# Patient Record
Sex: Male | Born: 1955 | Race: Black or African American | Hispanic: No | Marital: Single
Health system: Southern US, Community
[De-identification: ages and names within clinical notes are randomized; demographics above are authoritative.]

---

## 2003-11-18 ENCOUNTER — Ambulatory Visit: Payer: Self-pay | Admitting: Oncology

## 2003-12-19 ENCOUNTER — Ambulatory Visit: Payer: Self-pay | Admitting: Oncology

## 2004-01-22 ENCOUNTER — Ambulatory Visit: Payer: Self-pay | Admitting: Oncology

## 2004-02-18 ENCOUNTER — Ambulatory Visit: Payer: Self-pay | Admitting: Oncology

## 2004-03-20 ENCOUNTER — Ambulatory Visit: Payer: Self-pay | Admitting: Oncology

## 2004-04-17 ENCOUNTER — Ambulatory Visit: Payer: Self-pay | Admitting: Oncology

## 2004-06-04 ENCOUNTER — Ambulatory Visit: Payer: Self-pay | Admitting: Oncology

## 2004-06-17 ENCOUNTER — Ambulatory Visit: Payer: Self-pay | Admitting: Oncology

## 2004-09-04 ENCOUNTER — Ambulatory Visit: Payer: Self-pay | Admitting: Oncology

## 2007-10-25 ENCOUNTER — Other Ambulatory Visit: Payer: Self-pay

## 2007-10-25 ENCOUNTER — Inpatient Hospital Stay: Payer: Self-pay | Admitting: Internal Medicine

## 2008-03-02 ENCOUNTER — Ambulatory Visit: Payer: Self-pay

## 2011-02-08 ENCOUNTER — Emergency Department: Payer: Self-pay | Admitting: Emergency Medicine

## 2011-05-06 ENCOUNTER — Inpatient Hospital Stay: Payer: Self-pay | Admitting: Internal Medicine

## 2011-05-06 LAB — COMPREHENSIVE METABOLIC PANEL
Albumin: 3.2 g/dL — ABNORMAL LOW (ref 3.4–5.0)
Alkaline Phosphatase: 70 U/L (ref 50–136)
BUN: 34 mg/dL — ABNORMAL HIGH (ref 7–18)
Bilirubin,Total: 0.7 mg/dL (ref 0.2–1.0)
Chloride: 91 mmol/L — ABNORMAL LOW (ref 98–107)
Co2: 24 mmol/L (ref 21–32)
Creatinine: 1.53 mg/dL — ABNORMAL HIGH (ref 0.60–1.30)
EGFR (Non-African Amer.): 51 — ABNORMAL LOW
Glucose: 218 mg/dL — ABNORMAL HIGH (ref 65–99)
Osmolality: 269 (ref 275–301)
SGPT (ALT): 17 U/L
Sodium: 127 mmol/L — ABNORMAL LOW (ref 136–145)
Total Protein: 8.5 g/dL — ABNORMAL HIGH (ref 6.4–8.2)

## 2011-05-06 LAB — CBC
HCT: 31.3 % — ABNORMAL LOW (ref 40.0–52.0)
HGB: 10.4 g/dL — ABNORMAL LOW (ref 13.0–18.0)
MCH: 30.4 pg (ref 26.0–34.0)
RBC: 3.42 10*6/uL — ABNORMAL LOW (ref 4.40–5.90)

## 2011-05-06 LAB — CK TOTAL AND CKMB (NOT AT ARMC): CK-MB: 0.9 ng/mL (ref 0.5–3.6)

## 2011-05-07 LAB — BASIC METABOLIC PANEL
Anion Gap: 14 (ref 7–16)
BUN: 31 mg/dL — ABNORMAL HIGH (ref 7–18)
Calcium, Total: 10.1 mg/dL (ref 8.5–10.1)
Chloride: 97 mmol/L — ABNORMAL LOW (ref 98–107)
Creatinine: 1.46 mg/dL — ABNORMAL HIGH (ref 0.60–1.30)
Osmolality: 276 (ref 275–301)
Potassium: 4.9 mmol/L (ref 3.5–5.1)

## 2011-05-07 LAB — CBC WITH DIFFERENTIAL/PLATELET
Basophil %: 0 %
Eosinophil %: 0 %
HCT: 31.7 % — ABNORMAL LOW (ref 40.0–52.0)
HGB: 10.4 g/dL — ABNORMAL LOW (ref 13.0–18.0)
Lymphocyte %: 1.4 %
MCH: 30.6 pg (ref 26.0–34.0)
Monocyte %: 1 %
Neutrophil %: 97.6 %
RBC: 3.4 10*6/uL — ABNORMAL LOW (ref 4.40–5.90)
WBC: 31.1 10*3/uL — ABNORMAL HIGH (ref 3.8–10.6)

## 2011-05-07 LAB — OSMOLALITY, URINE: Osmolality: 666 mOsm/kg

## 2011-05-07 LAB — HEMOGLOBIN A1C: Hemoglobin A1C: 6 % (ref 4.2–6.3)

## 2011-05-08 LAB — CBC WITH DIFFERENTIAL/PLATELET
Basophil #: 0 10*3/uL (ref 0.0–0.1)
Basophil %: 0 %
HCT: 29 % — ABNORMAL LOW (ref 40.0–52.0)
HGB: 9.8 g/dL — ABNORMAL LOW (ref 13.0–18.0)
Lymphocyte %: 2.1 %
MCHC: 33.6 g/dL (ref 32.0–36.0)
Monocyte %: 1.9 %
Neutrophil #: 19 10*3/uL — ABNORMAL HIGH (ref 1.4–6.5)
Neutrophil %: 95.9 %
Platelet: 296 10*3/uL (ref 150–440)
RDW: 16.6 % — ABNORMAL HIGH (ref 11.5–14.5)
WBC: 19.8 10*3/uL — ABNORMAL HIGH (ref 3.8–10.6)

## 2011-05-08 LAB — BASIC METABOLIC PANEL
Anion Gap: 13 (ref 7–16)
BUN: 24 mg/dL — ABNORMAL HIGH (ref 7–18)
Calcium, Total: 9.3 mg/dL (ref 8.5–10.1)
Chloride: 101 mmol/L (ref 98–107)
Co2: 24 mmol/L (ref 21–32)
EGFR (African American): >60
Osmolality: 279 (ref 275–301)
Potassium: 4 mmol/L (ref 3.5–5.1)

## 2011-05-09 LAB — CBC WITH DIFFERENTIAL/PLATELET
Basophil #: 0 10*3/uL (ref 0.0–0.1)
Eosinophil #: 0 10*3/uL (ref 0.0–0.7)
Eosinophil %: 0.2 %
HCT: 29.1 % — ABNORMAL LOW (ref 40.0–52.0)
Lymphocyte #: 0.6 10*3/uL — ABNORMAL LOW (ref 1.0–3.6)
MCV: 91 fL (ref 80–100)
Monocyte %: 6 %
Platelet: 317 10*3/uL (ref 150–440)
RBC: 3.19 10*6/uL — ABNORMAL LOW (ref 4.40–5.90)
WBC: 8.7 10*3/uL (ref 3.8–10.6)

## 2011-05-11 LAB — CULTURE, BLOOD (SINGLE)

## 2011-05-18 ENCOUNTER — Inpatient Hospital Stay: Payer: Self-pay | Admitting: Internal Medicine

## 2011-05-18 LAB — URINALYSIS, COMPLETE
Blood: NEGATIVE
Hyaline Cast: 6
Leukocyte Esterase: NEGATIVE
Nitrite: NEGATIVE
Ph: 6 (ref 4.5–8.0)
RBC,UR: <1 /HPF (ref 0–5)
Specific Gravity: 1.014 (ref 1.003–1.030)
Squamous Epithelial: 1
WBC UR: 1 /HPF (ref 0–5)

## 2011-05-18 LAB — CBC
HCT: 33 % — ABNORMAL LOW (ref 40.0–52.0)
MCHC: 33.3 g/dL (ref 32.0–36.0)
MCV: 92 fL (ref 80–100)
Platelet: 349 10*3/uL (ref 150–440)
WBC: 12.6 10*3/uL — ABNORMAL HIGH (ref 3.8–10.6)

## 2011-05-18 LAB — COMPREHENSIVE METABOLIC PANEL
Alkaline Phosphatase: 79 U/L (ref 50–136)
BUN: 43 mg/dL — ABNORMAL HIGH (ref 7–18)
Bilirubin,Total: 0.4 mg/dL (ref 0.2–1.0)
Calcium, Total: 9.4 mg/dL (ref 8.5–10.1)
Chloride: 99 mmol/L (ref 98–107)
Co2: 23 mmol/L (ref 21–32)
Creatinine: 1.57 mg/dL — ABNORMAL HIGH (ref 0.60–1.30)
EGFR (African American): 60 — ABNORMAL LOW
EGFR (Non-African Amer.): 49 — ABNORMAL LOW
Glucose: 91 mg/dL (ref 65–99)
Potassium: 4.7 mmol/L (ref 3.5–5.1)
SGPT (ALT): 22 U/L
Sodium: 135 mmol/L — ABNORMAL LOW (ref 136–145)

## 2011-05-18 LAB — DRUG SCREEN, URINE
Amphetamines, Ur Screen: NEGATIVE (ref ?–1000)
Benzodiazepine, Ur Scrn: NEGATIVE (ref ?–200)
Cannabinoid 50 Ng, Ur ~~LOC~~: NEGATIVE (ref ?–50)
MDMA (Ecstasy)Ur Screen: NEGATIVE (ref ?–500)
Methadone, Ur Screen: NEGATIVE (ref ?–300)
Opiate, Ur Screen: NEGATIVE (ref ?–300)
Tricyclic, Ur Screen: NEGATIVE (ref ?–1000)

## 2011-05-18 LAB — TROPONIN I: Troponin-I: <0.02 ng/mL

## 2011-05-19 LAB — CBC WITH DIFFERENTIAL/PLATELET
Basophil #: 0.1 10*3/uL (ref 0.0–0.1)
HCT: 29.5 % — ABNORMAL LOW (ref 40.0–52.0)
MCH: 30.5 pg (ref 26.0–34.0)
MCV: 93 fL (ref 80–100)
Monocyte %: 6.3 %
Neutrophil #: 4.2 10*3/uL (ref 1.4–6.5)
Neutrophil %: 76.3 %
Platelet: 344 10*3/uL (ref 150–440)
RBC: 3.19 10*6/uL — ABNORMAL LOW (ref 4.40–5.90)
RDW: 17.5 % — ABNORMAL HIGH (ref 11.5–14.5)

## 2011-05-19 LAB — BASIC METABOLIC PANEL
BUN: 28 mg/dL — ABNORMAL HIGH (ref 7–18)
Chloride: 101 mmol/L (ref 98–107)
Co2: 24 mmol/L (ref 21–32)
Creatinine: 1.33 mg/dL — ABNORMAL HIGH (ref 0.60–1.30)
EGFR (African American): >60
EGFR (Non-African Amer.): 60 — ABNORMAL LOW
Glucose: 93 mg/dL (ref 65–99)
Osmolality: 277 (ref 275–301)
Potassium: 4.6 mmol/L (ref 3.5–5.1)
Sodium: 136 mmol/L (ref 136–145)

## 2011-05-26 ENCOUNTER — Emergency Department: Payer: Self-pay | Admitting: Emergency Medicine

## 2011-11-03 LAB — BASIC METABOLIC PANEL
Anion Gap: 6 — ABNORMAL LOW (ref 7–16)
BUN: 15 mg/dL (ref 7–18)
Calcium, Total: 9.3 mg/dL (ref 8.5–10.1)
Co2: 26 mmol/L (ref 21–32)
Creatinine: 1.13 mg/dL (ref 0.60–1.30)
EGFR (African American): >60
EGFR (Non-African Amer.): >60
Glucose: 92 mg/dL (ref 65–99)
Potassium: 4.3 mmol/L (ref 3.5–5.1)
Sodium: 141 mmol/L (ref 136–145)

## 2011-11-03 LAB — CBC
HCT: 35.9 % — ABNORMAL LOW (ref 40.0–52.0)
HGB: 12 g/dL — ABNORMAL LOW (ref 13.0–18.0)
MCH: 30.4 pg (ref 26.0–34.0)
MCHC: 33.4 g/dL (ref 32.0–36.0)
MCV: 91 fL (ref 80–100)
Platelet: 245 10*3/uL (ref 150–440)
RBC: 3.94 10*6/uL — ABNORMAL LOW (ref 4.40–5.90)
RDW: 15 % — ABNORMAL HIGH (ref 11.5–14.5)
WBC: 4.9 10*3/uL (ref 3.8–10.6)

## 2011-11-04 LAB — MAGNESIUM: Magnesium: 1.2 mg/dL — ABNORMAL LOW

## 2011-11-04 LAB — TSH: Thyroid Stimulating Horm: 40.3 u[IU]/mL — ABNORMAL HIGH

## 2011-11-05 ENCOUNTER — Inpatient Hospital Stay: Payer: Self-pay | Admitting: Internal Medicine

## 2011-11-05 LAB — BASIC METABOLIC PANEL
Calcium, Total: 8.8 mg/dL (ref 8.5–10.1)
Chloride: 100 mmol/L (ref 98–107)
Co2: 25 mmol/L (ref 21–32)
EGFR (African American): >60
Potassium: 3.4 mmol/L — ABNORMAL LOW (ref 3.5–5.1)
Sodium: 136 mmol/L (ref 136–145)

## 2011-11-05 LAB — T4, FREE: Free Thyroxine: 0.77 ng/dL (ref 0.76–1.46)

## 2011-11-05 LAB — CBC WITH DIFFERENTIAL/PLATELET
Basophil %: 0.3 %
Eosinophil #: 0 10*3/uL (ref 0.0–0.7)
Eosinophil %: 0.4 %
HGB: 10.6 g/dL — ABNORMAL LOW (ref 13.0–18.0)
Lymphocyte %: 3.3 %
MCH: 29.4 pg (ref 26.0–34.0)
MCHC: 32.8 g/dL (ref 32.0–36.0)
Neutrophil %: 91.9 %
Platelet: 232 10*3/uL (ref 150–440)
RBC: 3.6 10*6/uL — ABNORMAL LOW (ref 4.40–5.90)
WBC: 12.7 10*3/uL — ABNORMAL HIGH (ref 3.8–10.6)

## 2011-11-06 LAB — CBC WITH DIFFERENTIAL/PLATELET
Basophil #: 0 10*3/uL (ref 0.0–0.1)
Basophil %: 0.4 %
Eosinophil #: 0 10*3/uL (ref 0.0–0.7)
Eosinophil %: 0.6 %
HCT: 31.1 % — ABNORMAL LOW (ref 40.0–52.0)
HGB: 10.4 g/dL — ABNORMAL LOW (ref 13.0–18.0)
Lymphocyte #: 0.5 10*3/uL — ABNORMAL LOW (ref 1.0–3.6)
MCH: 30.2 pg (ref 26.0–34.0)
MCV: 91 fL (ref 80–100)
Monocyte #: 0.5 x10 3/mm (ref 0.2–1.0)
Monocyte %: 6.1 %
Neutrophil #: 6.7 10*3/uL — ABNORMAL HIGH (ref 1.4–6.5)
Neutrophil %: 86.4 %
RBC: 3.43 10*6/uL — ABNORMAL LOW (ref 4.40–5.90)
WBC: 7.8 10*3/uL (ref 3.8–10.6)

## 2011-11-06 LAB — BASIC METABOLIC PANEL
Anion Gap: 7 (ref 7–16)
Calcium, Total: 8.8 mg/dL (ref 8.5–10.1)
Creatinine: 1.24 mg/dL (ref 0.60–1.30)
EGFR (African American): >60
EGFR (Non-African Amer.): >60
Osmolality: 274 (ref 275–301)
Sodium: 137 mmol/L (ref 136–145)

## 2011-11-24 ENCOUNTER — Ambulatory Visit: Payer: Self-pay | Admitting: Internal Medicine

## 2012-06-21 ENCOUNTER — Emergency Department: Payer: Self-pay | Admitting: Emergency Medicine

## 2012-08-18 ENCOUNTER — Ambulatory Visit: Payer: Self-pay | Admitting: Family

## 2012-09-28 ENCOUNTER — Emergency Department: Payer: Self-pay | Admitting: Emergency Medicine

## 2012-10-08 ENCOUNTER — Emergency Department: Payer: Self-pay | Admitting: Unknown Physician Specialty

## 2012-12-06 IMAGING — CR DG CHEST 1V PORT
1 series · 1 of 1 positions shown · non-contrast
Comparison: none

REASON FOR EXAM: Low O2 sat, congestion
COMMENTS:

[ap]
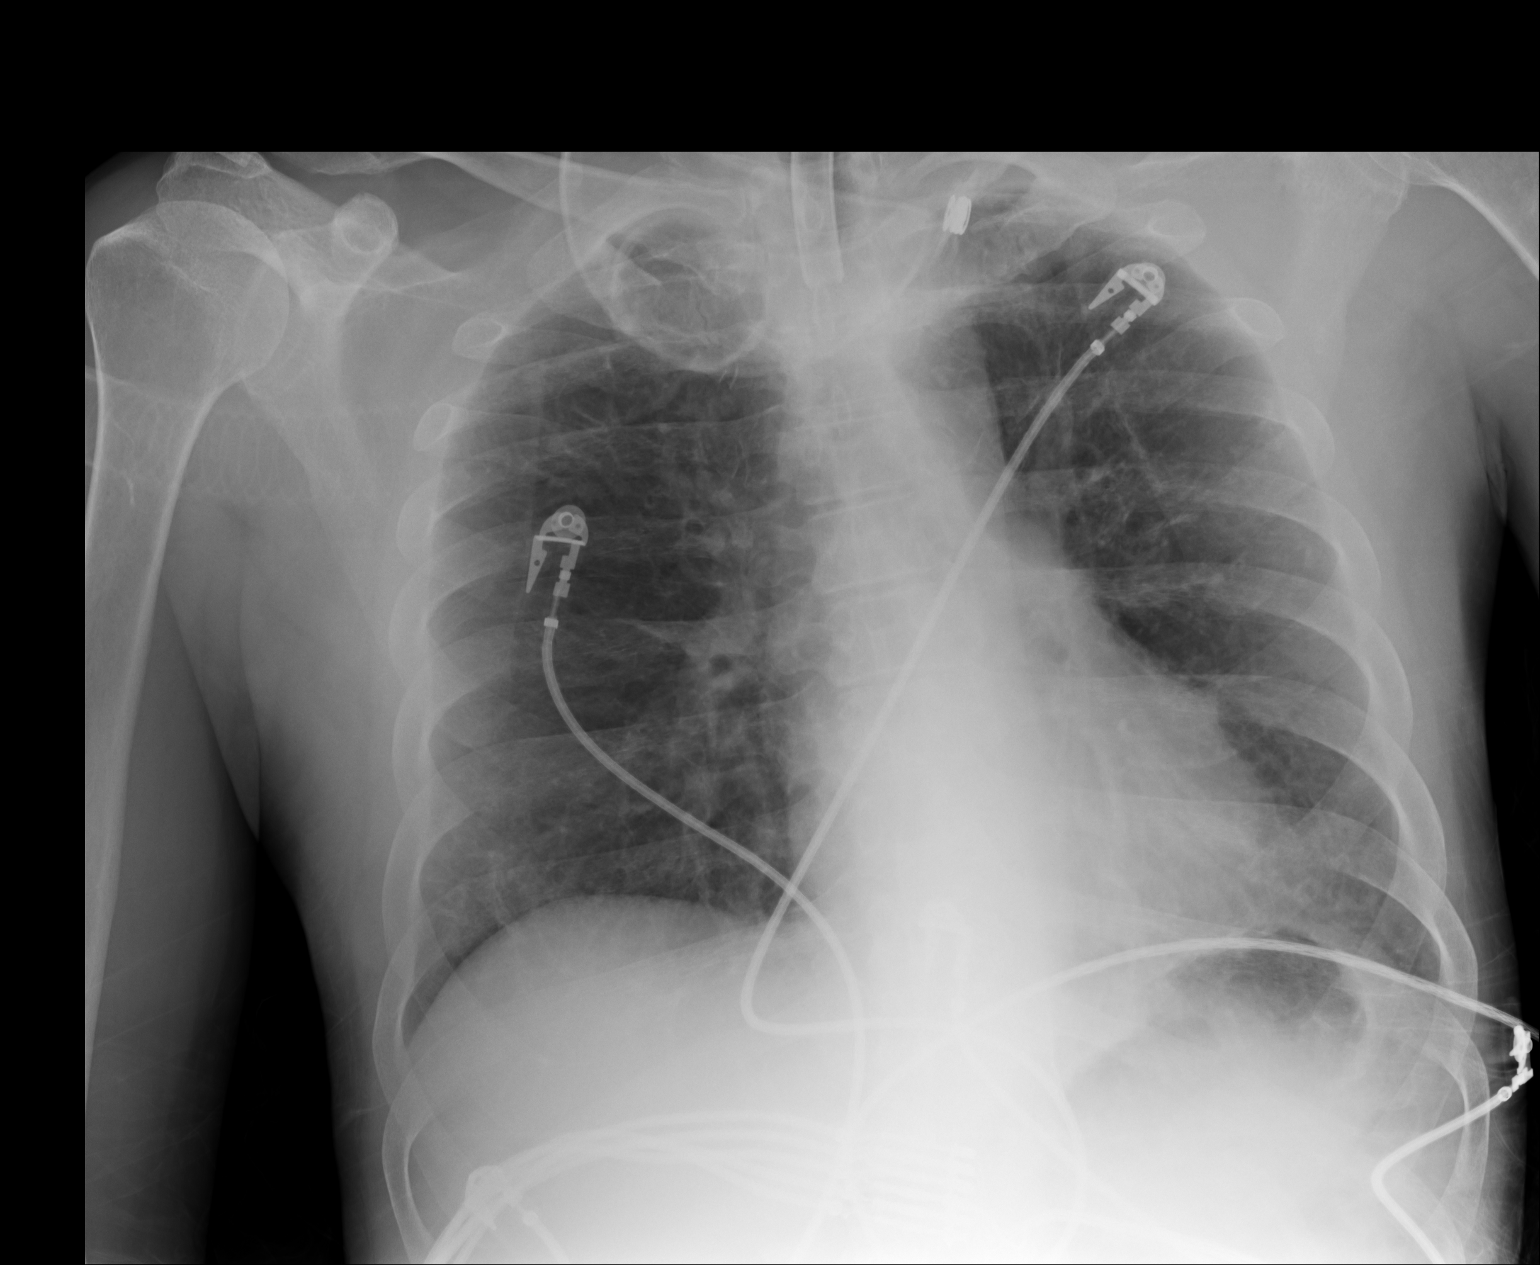

[1 of 1 positions shown; findings below may reference images not displayed]

PROCEDURE:     DXR - DXR PORTABLE CHEST SINGLE VIEW  - May 18, 2011 [DATE]

RESULT:     Comparison is made to the study 06 May, 2011.

Tracheostomy tube is present. Cardiac monitoring electrodes are present.
There is improving aeration at the lung bases with decreased interstitial
prominence. No significant effusion is visualized on this single projection.
The cardiac silhouette appears normal. No pneumothorax or pneumomediastinum
is evident.
IMPRESSION: 1. Improving aeration especially at the right lung base. Tracheostomy
present.
2. No cardiomegaly evident.

## 2013-05-12 ENCOUNTER — Ambulatory Visit: Payer: Self-pay | Admitting: Internal Medicine

## 2013-06-14 IMAGING — CR DG CHEST 2V
1 series · 2 of 2 positions shown · non-contrast
Comparison: none

REASON FOR EXAM: wheezing pneumonia
COMMENTS:

PROCEDURE:     KDR - KDXR CHEST PA (OR AP) AND LAT  - November 24, 2011 [DATE]
RESULT:     Comparison: 11/05/2011 and chest CT 11/04/2011

[Series 1: pa · 0.17mm/px · 2 of 2 slices shown]
[im 1/2]
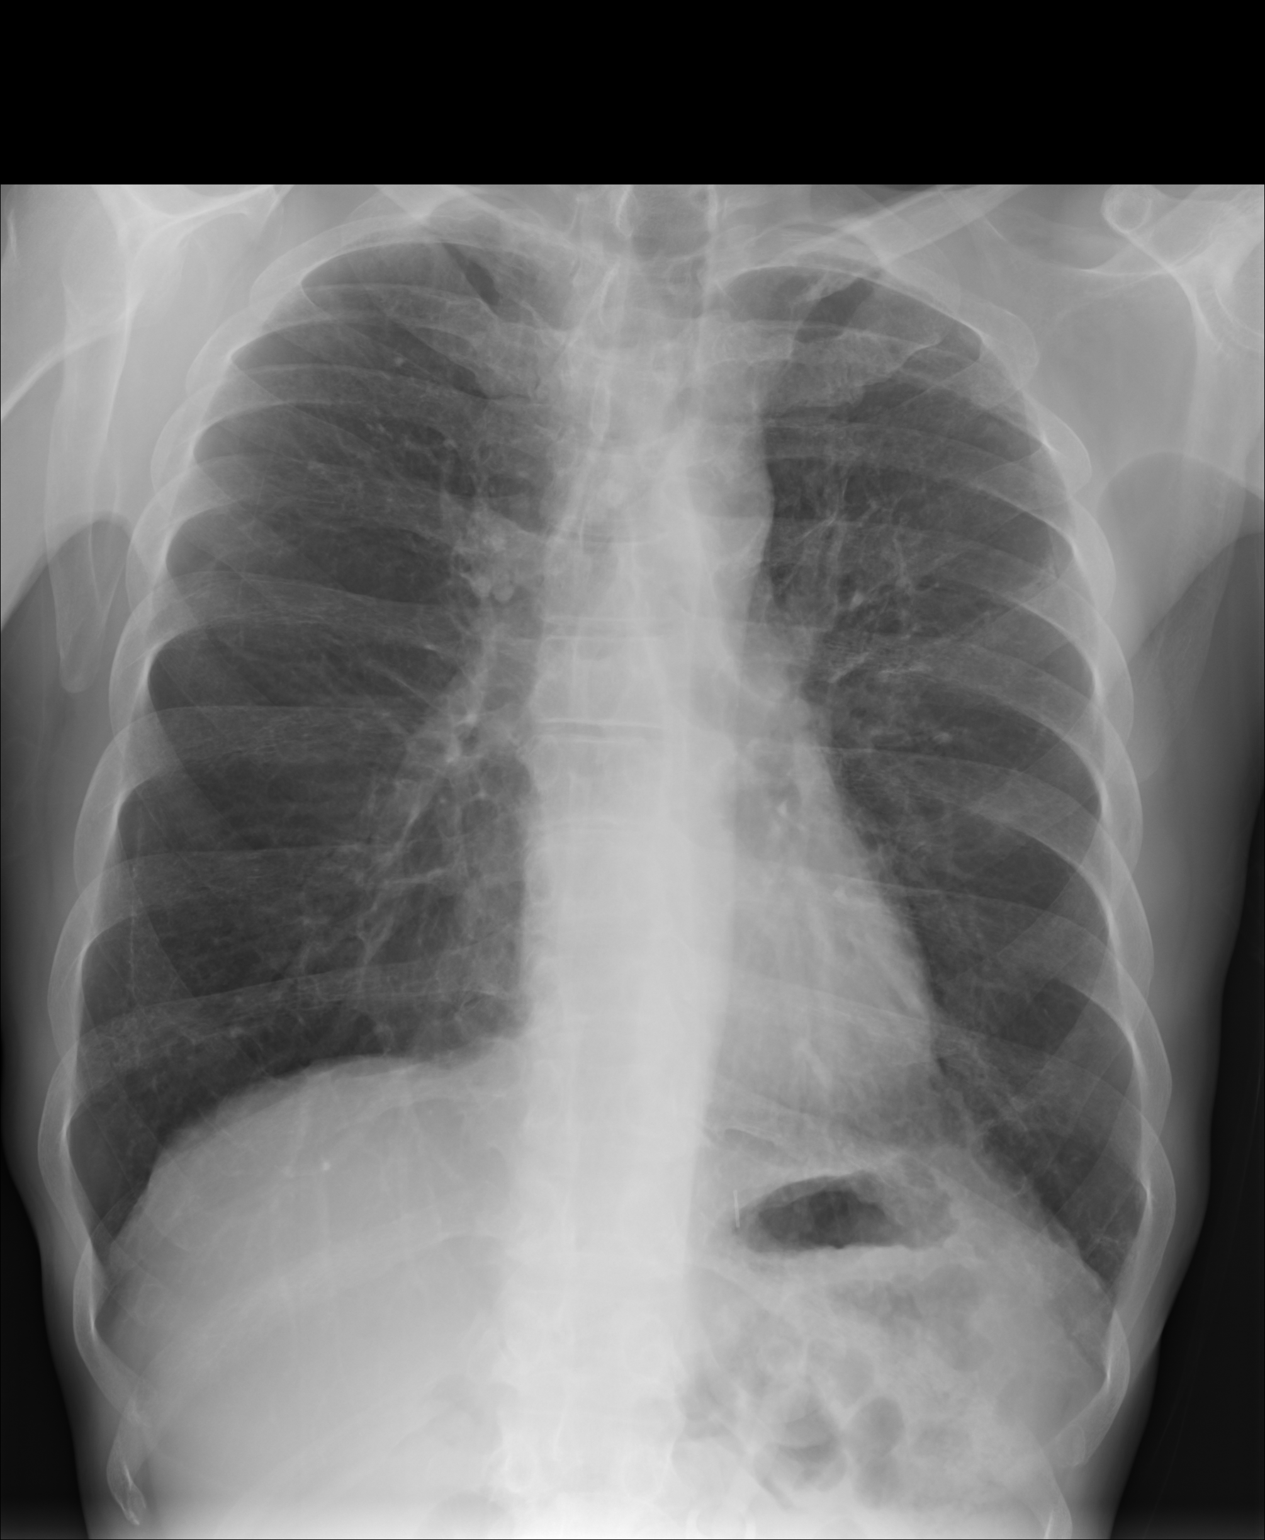
[im 2/2]
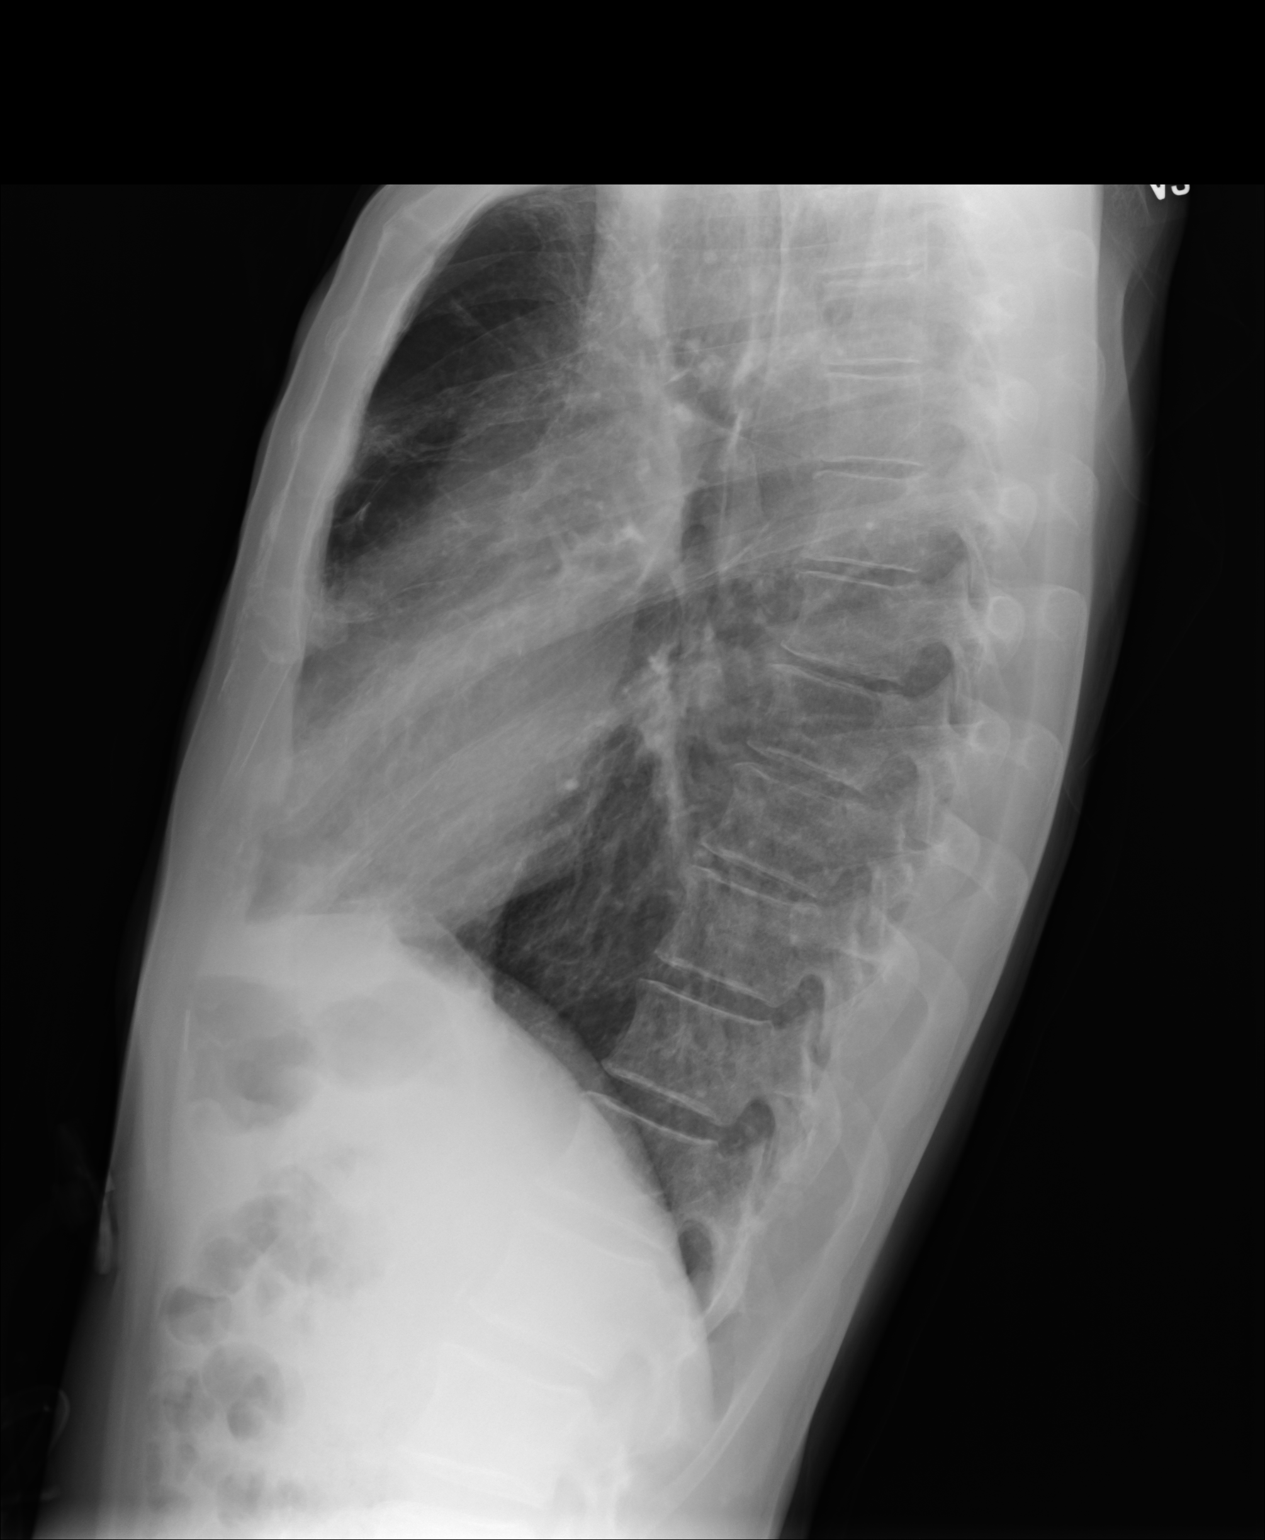

[2 of 2 positions shown; findings below may reference images not displayed]

FINDINGS: The heart and mediastinum are stable. The lungs are hyperinflated. Reticular
opacities in the left lower lung are similar to slightly decreased from
prior.
IMPRESSION: Reticular opacities in the left lower lung are similar to slight decreased
from prior. Given the appearance on the prior chest CT, differential would
include infection, such as atypical infection, and aspiration. Continued
followup is recommended.

[REDACTED]

## 2014-05-05 ENCOUNTER — Emergency Department: Payer: Self-pay | Admitting: Emergency Medicine

## 2014-06-06 NOTE — Op Note (Signed)
PATIENT NAME:  Billy Hess, Billy Hess MR#:  [id number] DATE OF BIRTH:  [birth year]  DATE OF PROCEDURE:  11/03/2011  PREOPERATIVE DIAGNOSES:  1. 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 2. Nonhealing tracheal stoma.   POSTOPERATIVE DIAGNOSES:  1. 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 2. Nonhealing tracheal stoma.   OPERATIVE PROCEDURES: 1. Tracheoplasty with closure of the tracheal stoma. 2. Auricular cartilage graft from right auricle to the anterior tracheal wall. 3. Transposition flap of right sternocleidomastoid to close the wound.  4. Advancement flap closure of the skin to close the wound.  5. Harvesting auricular cartilage graft from the right auricle.   SURGEON: Cammy CopaPaul H. Chaniece Barbato, MD  ANESTHESIA: General.   COMPLICATIONS: None.   TOTAL ESTIMATED BLOOD LOSS: 10 mL.   DESCRIPTION OF PROCEDURE: Patient was given general anesthesia by oral endotracheal intubation. You could see the trach tube going through the trachea and the cuff beneath that with the open tracheal stoma. The skin was very thin and densely adherent to the tissues from previous radiation therapy. The sternocleidomastoid muscles were very firm and the stoma was 1 cm in width and 2 cm in height and was very firm around it. There is no sign of any granulation tissue. There is no soft tissue here and it was not mobile around the area at all.   Once the patient was asleep, he was prepped and draped in sterile fashion. The skin was then injected with 1% local lidocaine with epinephrine 1:100,000, total of 7 mL were used here. This infiltrated in the skin and all the way around the trach stoma and also used 3 mL in the right auricle and postauricular area.   Incision was created in the anterior neck in a horizontal fashion but more semicircular. A small ellipse was created around the stoma site. Skin was elevated on this flap and down to the trachea. The skin  was then elevated anteriorly and inferiorly to create skin flaps where the skin could be advanced for closure. The superior flap was carried all the way up into the submental area to loosen up skin over the trachea. Inferior flap was carried way down over the sternum to free up skin that can be advanced forward.   The tracheal closure was begun with trimming some of the skin at the stoma and folding it over on itself to imbricate it and close the stoma with a skin lining inside. 5-0 PDS was used for closure here in an interrupted fashion. This created a fairly airtight closure.   The auricular graft was then harvested from the right auricle. Incision was made inside the conchal bowl on its posterior wall. The skin was elevated over the cartilage anteriorly leaving the periosteum down. An incision was made posteriorly in a semicircular fashion and then the cartilage and perichondrium was elevated also posteriorly and then incision was carried around its anterior border to free up the cartilage. The auricular wound was then closed using interrupted 5-0 nylon to close the flap down. Some pressure was placed here to keep the skin flaps against the soft tissue in the conchal bowl.   The auricular graft was trimmed slightly to fit the defect. This had perichondrium on both sides of the cartilage to help it to heal. The cartilage graft was sutured down using 4-0 PDS sutured all the way around to help make sure this closed over and held tight the suture line. It was tightened all the  way around with interrupted 4-0 PDS. This created the second layer of closure here.   The transposition flap sternocleidomastoid muscle was then done. It was elevated all the way down to its attachment at the clavicular head. This was freed up here. The muscle was extremely tight and once I cut the fascia it retracted back up some. It was freed up all the way up into the further upper part of the neck and then it was transposed across  midline and sutured to the opposite sternocleidomastoid muscle to hold it into position. Sutures are made superiorly to cover over the auricular cartilage and then inferiorly as well to cover over the auricular cartilage and there appeared to be a nice airtight closure here.   The skin edges were then advanced to the midline and were then sutured together with 4-0 Vicryl for the dermis. There was a 7 [place] TLS drain that was placed in this layer to make sure there is no hematoma or air collecting underneath the flap. Once the dermis was closed all the way across there was a slight inferior dog ear that was trimmed and closed with 4-0 Vicryl as well. The skin edges were then held in position with staples. Wound was covered with Neosporin. The drain site was taped down. There is no obvious leak. Patient was awakened and watched with no sign of any crepitus or problems in neck. He has taken to the recovery room in satisfactory condition. There were no operative complications. His right ear was bandaged placing some ointment and nonstick Telfa into his auricle then a cotton ball to help fit and mold the area followed then by fluffy pressure dressing and Glasscock ear dressing around it. Patient tolerated the procedure well. He was taken to the recovery room in satisfactory condition. There were no operative complications.    ____________________________ Cammy Copa, MD phj:cms D: 11/03/2011 13:12:28 ET T: 11/03/2011 13:26:03 ET JOB#: [id number]  cc: Cammy Copa, MD, <Dictator>  Cammy Copa MD ELECTRONICALLY SIGNED 11/03/2011 14:08

## 2014-06-06 NOTE — Consult Note (Signed)
PATIENT NAME:  Billy Hess, Billy Hess MR#:  [id number] DATE OF BIRTH:  Oct 18, 1955  DATE OF CONSULTATION:  11/03/2011  REFERRING PHYSICIAN:  Cammy Copa, MD CONSULTING PHYSICIAN:  Latrice Storlie A. Allena Katz, MD PRIMARY CARE PHYSICIAN: Corky Downs, MD   REASON FOR CONSULTATION: Hypoxia and tachycardia.   HISTORY OF PRESENT ILLNESS: Mr. Littler is a 59 year old African American gentleman who was admitted today for tracheoplasty, underwent surgery by Dr. Elenore Rota under general anesthesia. The patient has had a history of patent tracheal stoma status post tracheostomy removal which was placed due to carcinoma of the larynx postradiation therapy. The patient underwent surgery. Prior to surgery, his blood pressure on arrival in the morning was 200 systolic over 100s. He had taken his morning medications for blood pressure. The patient did receive some medication prior to surgery. He tolerated the surgery well. The patient was re-evaluated by Dr. Elenore Rota around 6:00, was doing well. He did have some clear liquid diet. Thereafter, the patient became hypoxic and was noted to have tachycardia on routine vitals. His heart rate was in the 130s to 140s, sinus tachycardic, and his sats dropped down into the 70s. Internal Medicine was consulted for hypoxia and tachycardia. During my evaluation, the patient appears non-anxious, denies any chest pain, shortness of breath, any cough, any wheezing or any pain at the surgical site. He did tolerate some clear liquids. His sats were 97% on nonrebreather. The patient has a long-standing history of smoking, has history of chronic obstructive pulmonary disease and chronic respiratory failure. There is no fever noted on routine vitals.   PAST SURGICAL HISTORY:  1. History of tracheostomy 07/2010.  2. G-tube placement 07/2010 secondary to carcinoma of the larynx.  3. Tracheostomy removal 07/2011.   PAST MEDICAL HISTORY:  1. Right occipitoparietal hemorrhage in December 2012.  2. Seizure  disorder secondary to old stroke.  3. Chronic alcohol abuse in the past.  4. Chronic anemia.  5. Carcinoma of the larynx.  6. Hypertension.   7. Former smoker, 1 pack per day for 30 years.  8. Hypothyroidism, recently started on Synthroid.   HOME MEDICATIONS:  1. Multivitamin daily.  2. Lisinopril 10 mg daily.  3. Levothyroxine 25 mcg p.o. daily.  4. Levetiracetam 500 mg p.o. b.i.d.  5. Hydralazine 25 mg q.i.d.  6. Aspirin 81 mg via G-tube daily.  7. Amlodipine 10 mg daily.   FAMILY HISTORY: Positive for hypertension.   SOCIAL HISTORY: She lives at home. She denies any alcohol use at this time. She denies smoking.   REVIEW OF SYSTEMS: CONSTITUTIONAL: No fever, fatigue, weakness. EYES: No blurred or double vision. ENT: No tinnitus, ear pain, hearing loss. RESPIRATORY: No cough, wheeze, hemoptysis. CARDIOVASCULAR: Positive for tachycardia. No chest pain, orthopnea, or edema. GASTROINTESTINAL: No nausea, vomiting, diarrhea, or abdominal pain. GENITOURINARY: No dysuria or hematuria. ENDOCRINE: No polyuria or nocturia. HEMATOLOGY: No anemia or easy bruising. SKIN: No acne or rash. MUSCULOSKELETAL: Positive for arthritis. NEUROLOGIC: History of cerebrovascular accident in the past, no transient ischemic attack. PSYCHIATRIC: No anxiety, depression. All other systems reviewed and negative.   PHYSICAL EXAMINATION:  GENERAL: The patient is awake, alert, oriented x3,  not in acute distress.   VITAL SIGNS: He is afebrile, pulse is 130 to 132, sinus tachycardia. Blood pressure is 138/84, saturations are 98% on nonrebreather.   HEENT: Atraumatic, normocephalic. Pupils are equal, round and reactive to light and accommodation. Extraocular movements intact. Oral mucosa is moist.   NECK: The patient has surgical staples present over the front of  the neck. No swelling, crepitus heard over the neck muscles or upper chest.   RESPIRATORY: Clear to auscultation bilaterally. Distant breath sounds. There are  no rales, rhonchi, respiratory distress, or labored breathing.   CARDIOVASCULAR: Tachycardia present. No murmur heard. PMI not lateralized. Chest nontender.   EXTREMITIES: Good pedal pulses, good femoral pulses. No lower extremity edema. No calf tenderness, no calf swelling.   ABDOMEN: Soft, benign, and nontender. G tube present. The site looks okay.   NEUROLOGIC: Grossly intact cranial nerves II through XII.  No major motor or sensory deficit.   PSYCHIATRIC: The patient is awake, alert, oriented x3.   LABORATORY, DIAGNOSTIC AND RADIOLOGICAL DATA: EKG shows sinus tachycardia with short PR intervals. Chest x-ray consistent with chronic obstructive pulmonary disease. I do not see any obvious pneumonia or any findings suggestive of congestive heart failure or any subcutaneous air seen in the soft tissues around the neck. Arterial blood gas: pH is 7.39, pCO2 is 43, PaO2 63, FiO2 of 100% on nonrebreather. Hemoglobin and hematocrit is 12.0 and 35.9. White count is 4.9, platelet count is 245. Basic metabolic panel within normal limits.   ASSESSMENT: Mr. Sherrell PullerMendez is a 59 year old who is status post tracheoplasty with right auricular graft harvest along with extensive flap surgery using sternocleidomastoid muscle done by Dr. Elenore RotaJuengel which was to  close the patent tracheal stoma secondary to tracheostomies due to carcinoma of the larynx, presents with:   1. Hypoxia with saturations dropping down in the 70s: The patient currently is on nonrebreather with saturations of 97%. Etiology appears unclear at this time. He appears very comfortable, no neck pain, chest pain, wheezing, or any cough. He had some clear liquid diet. ABG shows mild hypoxia which is likely the patient's baseline given emphysema and chronic respiratory failure with history of ex-smoker for 30 years in the past. Chest x-ray is suggestive of emphysema/chronic obstructive pulmonary disease, no infiltrates, no pulmonary edema or subcutaneous  emphysema. The patient is ambulatory in his routine, walks about 30 minutes every day. No recent illness other than surgery today. We will check D-dimer stat, order a CT scan to rule out PE. Continue current medications as you are. We will do nebs p.r.n. The patient does not seem to be in chronic obstructive pulmonary disease flare, so I will hold off on any IV steroids at this time.  2. Sinus tachycardia along with hypoxia: The patient denies any cardiac history. No chest pain or shortness of breath. EKG showed sinus tach with short PR interval. We will check cardiac enzymes and check magnesium, apply telemonitor and monitor. We may have to give IV metoprolol if  heart rate does not come down.  3. Status post tracheostomy stoma repair closure per Dr. Elenore RotaJuengel.   4. Hypertension: On amlodipine and hydralazine along with lisinopril.  5. History of seizure disorder: On Keppra.  6. History of hypothyroidism: On Synthroid.  7. Further work-up according to the patient's clinical course. We will transfer the patient to 2A  at this  time.   The above was discussed with Dr. Elenore RotaJuengel.  No family was present in the room.   TIME SPENT: 50 minutes.   Thank you for the consult.   ____________________________ Wylie HailSona A. Allena KatzPatel, MD sap:cbb D: 11/03/2011 23:11:03 ET T: 11/04/2011 10:28:48 ET JOB#: [id number]  cc: Sadiq Mccauley A. Allena KatzPatel, MD, <Dictator> Corky DownsJaved Masoud, MD Willow OraSONA A Joseff Luckman MD ELECTRONICALLY SIGNED 11/06/2011 13:24

## 2014-06-06 NOTE — Discharge Summary (Signed)
PATIENT NAME:  Billy FowlerMEBANE, Nayan MR#:  [id number] DATE OF BIRTH:  Nov 28, 1955  DATE OF ADMISSION:  11/05/2011 DATE OF DISCHARGE:  11/06/2011  DISCHARGE DIAGNOSES:  1. Hypoxia likely due to pneumonia and/or mucus plugging. Will require 2 liters oxygen via nasal cannula for now.  2. Sinus tachycardia along with hypoxia, now resolved. Could be due to pneumonia and mucus plugging.  3. Status post tracheostomy stoma repair and closure status post laryngeal carcinoma post radiation. Further management per ENT, Dr. Elenore RotaJuengel.  4. Hypomagnesemia, repleted and resolved.   SECONDARY DIAGNOSES:  1. Seizure disorder.  2. Chronic alcohol abuse.  3. Chronic anemia.  4. Carcinoma of the larynx.  5. Hypertension.  6. Hypothyroidism.   CONSULTATIONS:  1. ENT, Dr. Elenore RotaJuengel  2. Pulmonary, Dr. Freda MunroSaadat Khan   PROCEDURES/RADIOLOGY:  1. Chest x-ray on September 16th showed mild interstitial prominence, possibly chronic interstitial lung disease. Interstitial edema cannot be excluded.  2. Chest x-ray on September 18th showed possible aspiration on the left lower lung. Infection cannot be ruled out.  3. CT scan of the chest without contrast on September 17th at 1:33 showed patchy and nodular airspace disease greatest confluence in the lung bases. Interstitial and emphysematous changes in the upper lobe regions. Pulmonary nodule in the left upper lobe. Cholelithiasis. Calcified mediastinal lymph nodes, history of prior granulomatous disease. 4. CT scan of the chest with contrast on September 17th showed no PE. Possible mucus in the trachea and left mainstem bronchus.   MAJOR LABORATORY PANEL: TSH was 40.3. Free T4 was 0.77.   HISTORY AND SHORT HOSPITAL COURSE: The patient is a 59 year old male with above-mentioned medical problems who was admitted post tracheostomy stoma closure with hypoxia and sinus tachycardia. He was found to have possible pneumonia with underlying COPD. He was started on IV antibiotics along with  steroids. Pulmonary consultation was obtained with Dr. Freda MunroSaadat Khan who agreed with above management. He recommended adding Spiriva and Symbicort along with aggressive pulmonary toilet. The patient was slowly improving and was close to baseline on September 19th, although he was still requiring 2 liters oxygen which he will likely need at least short-term and might be long-term, need of which will be decided by outpatient primary care or lung physician.   On the date of discharge his vital signs were as follows: Temperature 97.6, heart rate 81 per minute, respirations 16 per minute, blood pressure 106/68 mmHg. He was saturating 99% on 2 liters oxygen via nasal cannula.   PERTINENT PHYSICAL EXAMINATION ON THE DATE OF DISCHARGE: CARDIOVASCULAR: S1, S2 normal. No murmur, rubs, or gallop. LUNGS: Clear to auscultation bilaterally. No wheezing, rales, rhonchi, or crepitation. ABDOMEN: Soft, benign. NEUROLOGIC: Nonfocal examination. All other physical examination remained at baseline.   DISCHARGE MEDICATIONS:  1. Aspirin 81 mg via G-tube daily. 2. Multivitamins once daily.  3. Levetiracetam 500 mg p.o. b.i.d.  4. Hydralazine 25 mg p.o. 4 times a day.  5. Levothyroxine 25 mcg p.o. daily. 6. Lisinopril 10 mg p.o. daily.  7. Amlodipine 10 mg p.o. daily.  8. Spiriva once daily.  9. Symbicort one inhalation twice a day.  10. Guaifenesin 600 mg p.o. b.i.d. for five days.  11. Augmentin 875/125 mg 1 tablet p.o. b.i.d. for five days.   DISCHARGE DIET: Regular.   DISCHARGE ACTIVITY: As tolerated.   DISCHARGE INSTRUCTIONS AND FOLLOW-UP:  1. The patient was instructed to use 2 liters oxygen via nasal cannula continuous for now until he sees his family doctor or lung physician when long-term oxygen  need will be determined.  2. He was instructed to keep dressing dry on the tracheostomy stoma closure and further dressing changes per ENT.  3. He will need follow-up with his primary care physician, [name] or  Dr. Juel Burrow, in 1 to 2 weeks.  4. He will need follow-up with Dr. Elenore Rota from ENT in 2 to 4 weeks. 5. Follow-up with Dr. Freda Munro in 2 to 4 weeks.   TOTAL TIME DISCHARGING THIS PATIENT: 55 minutes.   ____________________________ [name]. Sherryll Burger, MD vss:drc D: 11/10/2011 10:28:57 ET T: 11/11/2011 13:28:44 ET JOB#: [id number]  cc: Leanore Biggers S. Sherryll Burger, MD, <Dictator> Serita Sheller. Maryellen Pile, MD Corky Downs, MD Cammy Copa, MD Yevonne Pax, MD [name] [facility] MD ELECTRONICALLY SIGNED 11/11/2011 14:20

## 2014-06-11 NOTE — H&P (Signed)
PATIENT NAME:  Billy Hess, Billy MR#:  [id number] DATE OF BIRTH:  16-Sep-1955  DATE OF ADMISSION:  05/18/2011  PRIMARY CARE PHYSICIAN: Dr. Maryellen PileEason  ER physician: Dr. Gaetano NetKevin Boland    CHIEF COMPLAINT: Seizure.   HISTORY OF PRESENT ILLNESS: This is a 59 year old male patient with history of chronic respiratory failure on trach, hypertension, history of throat cancer status post tracheostomy and PEG, is at [facility]. Had two episodes of seizure witnessed by the staff so EMS went there and while in route to here he had another episode, mainly started as focal and then turned into grand mal seizure. Patient had jerking of hands and legs and also had postictal state and incontinence. Patient has no prior history of seizures. Patient did receive IV fosphenytoin one dose and Keppra 1 gram IV in the ER and did not have any further episodes. Right now he is awake, alert, oriented. Patient says that he had some headache this morning but nothing unusual. He did not feel any dizziness or blurred vision. Did not have any focal symptoms and was doing okay yesterday as well. Patient's initial CT of the head is negative for any acute hemorrhage. I was asked to admit for new onset seizure.   PAST MEDICAL HISTORY: 1. History of recent intracranial hemorrhage in December. He had right parietal intracranial hemorrhage in December and according to family he had one seizure at that time but was not started on any medicine.  2. History of chronic respiratory failure with trach and PEG.  3. History of throat and laryngeal cancer. 4. Cerebrovascular accident.  5. Hypertension. 6. Depression.  7. Recent hospitalization also significant for admitted on 03/18 and discharged on 03/22 for systemic inflammatory response syndrome with pneumonia and acute renal failure.   MEDICATIONS:  1. Tylenol as needed.  2. Amlodipine 5 mg daily. 3. Aspirin 81 mg daily. 4. DuoNebs every six hours as needed.  5. Nexium 40 mg  daily via PEG. 6. Trazodone 50 mg before bedtime.  7. He also was sent home with Levaquin and doxycycline and he is continued on oxygen via trach.   ALLERGIES: No known allergies.   SOCIAL HISTORY: Patient has heavy alcohol abuse history and smoking but he quit everything. Patient is living at [facility] nursing home.   FAMILY HISTORY: No significant history of cerebrovascular accident or cancers in the family.   PAST SURGICAL HISTORY:  1. Trach.  2. PEG.   REVIEW OF SYSTEMS: CONSTITUTIONAL: Patient has no fever or fatigue. EYES: No blurred vision. ENT: No tinnitus. No epistaxis. No difficulty swallowing. RESPIRATORY: No cough. No wheezing. CARDIOVASCULAR: No chest pain. No palpitations. GASTROINTESTINAL: No nausea. No vomiting. No abdominal pain. GENITOURINARY: No dysuria. ENDOCRINE: No polyuria, nocturia. INTEGUMENTARY: No skin rashes. MUSCULOSKELETAL: No joint pains. NEUROLOGIC: Patient has history of hemorrhage in the brain and also cerebrovascular accident before and had a seizure today, three episodes. PSYCH: No anxiety or insomnia.   PHYSICAL EXAMINATION: VITAL SIGNS: Temperature 97.6, pulse 100, respirations 15, blood pressure initially 81/74 but improved to  as of latest.   GENERAL: Patient is alert, awake, oriented, able to talk, has PMV for trach and communicating very well. Denies any complaints.   HEENT: Head atraumatic. Pupils are equally reacting to light. No conjunctivitis. Hearing is intact. Tympanic membranes clear. Nose-No turbinate hypertrophy.   NECK: Billy Hess is present in the neck; has some drainage noted from trach. Patient has no JVD. No carotid bruits.    RESPIRATORY: Clear to auscultation  bilaterally. No wheeze. Not using accessory muscles of respiration.   CARDIOVASCULAR: S1, S2 regular. No murmurs.   ABDOMEN: Soft. PEG tube in place. No hepatosplenomegaly.   MUSCULOSKELETAL: Strength is good, 5/5 in upper and lower extremities.   NEUROLOGIC: He is  alert, awake, oriented. Cranial nerves are intact II through XII.   MUSCULOSKELETAL: Motor exam is normal in upper and lower extremities; 5/5 power present. Deep tendon reflexes 2+ bilaterally. Patient has no dysarthria. Sensations are intact bilaterally.   PSYCH: Mood and affect are within normal limits.   LABORATORY, DIAGNOSTIC AND RADIOLOGICAL DATA: Urine hazy-colored urine and leukocyte esterase negative and bacteria trace. Urine toxicology negative. WBC 12.6, hemoglobin 11, hematocrit 33 and platelets 349. Electrolytes: Sodium 135, potassium 4.7, chloride 99, bicarbonate 23, BUN 43, creatinine 1.57, glucose 91. Troponin less than 0.02. CT of the head was done which showed posthemorrhagic infarct in the occipital region which was present previously only minimal band of density is present in the area of previous internal hemorrhage no definite infarct or mass or mass effect. Patient has air-fluid levels in the right maxillary sinus. Patient's chest x-ray shows improved aeration in the right lung base. Trach in place. No cardiomegaly. EKG showed normal sinus at 90 beats per minute. T wave inversion in lead V4, V5, and V3.   ASSESSMENT AND PLAN: This is a 59 year old male patient with chronic respiratory failure with trach and PEG, has seizures. 1. New onset, probably coming from old infarct area. Patient right now already got Keppra and fosphenytoin in the ER. Fosphenytoin 1 gram was given and Keppra 1 gram also is ordered. ER physician spoke to Dr. Kemper Durie, recommended to continue the Keppra and fosphenytoin so I have ordered the fosphenytoin and Keppra and patient will also get Ativan as needed p.r.n. for seizures 2 mg q.4 hours and seizure precautions. Will get an MRA of the brain along with neurology consult and EEG.  Consult Dr. Kemper Durie. 2. Chronic respiratory failure. Pneumonia is improved from last admission. Patient has sinusitis with leukocytosis. He is on Levaquin, continue that.  3. Acute renal  failure, likely due to dehydration. He is getting PEG feedings. We will increase the free water via PEG, about 100 mL q.4 hours and check the BMP in the morning and also urine output. Patient has hypertension. Initially blood pressure was low but he was started on dopamine in the ER but briefly then blood pressure improved. Right now he is not on any pressors and patient had acute renal failure probably related to hypotension episode as well. Patient right now has normal blood pressure so watch BMP in the morning and continue Norvasc for blood pressure.  4. Patient's condition is stable. CODE STATUS: FULL CODE.   TIME SPENT: About 60 minutes on this patient.   ____________________________ Katha Hamming, MD sk:cms D: 05/18/2011 15:47:59 ET T: 05/18/2011 16:10:35 ET JOB#: [id number]  cc: Katha Hamming, MD, <Dictator> Serita Sheller. Maryellen Pile, MD Katha Hamming MD ELECTRONICALLY SIGNED 05/19/2011 8:45

## 2014-06-11 NOTE — Discharge Summary (Signed)
PATIENT NAME:  Billy Hess, Billy Hess MR#:  [id number] DATE OF BIRTH:  1955-09-14  DATE OF ADMISSION:  05/06/2011 DATE OF DISCHARGE: 05/09/2011   DISCHARGE DISPOSITION:   Health Care  DISCHARGE DIAGNOSES:  1. Acute on chronic respiratory failure secondary to pneumonia, SIRS. 2. Acute renal failure, resolved.  3. History of chronic respiratory failure, status post trach and PEG. 4. Esophageal cancer and throat cancer. 5. History of high blood pressure.   DISCHARGE MEDICATIONS:  1. Tylenol as needed.  2. Amlodipine 5 mg daily.  3. Aspirin 81 mg daily.  4. DuoNebs every six hours as needed, 5. Colace 100 mg p.o. daily via PEG. 6. Nexium 40 mg via PEG. 7. Trazodone 50 mg one hour before bedtime.  8.   Levaquin 500 mg via PEG daily for 10 days. 9. Doxycycline 100 mg two times  daily for seven days.  10. Oxygen 4 liters via trach.  FOLLOWUP: Follow up with primary doctor, Dr. Maryellen PileEason, in two weeks.   CONSULTATIONS: None.   HOSPITAL COURSE:  1. The patient is a 59 year old male patient who came in from [facility] because of cough and fever, trouble breathing, and yellow-colored phlegm. The patient's chest x-ray showed right lower lobe pneumonia. The patient's white count was elevated on admission at 26.4. He also was tachycardic so he was admitted to the hospitalist service for hypoxic respiratory failure with SIRS and started on vancomycin and Zosyn. The patient also received nebulizers.  His  blood cultures have been negative. He had good improvement in his white count. It came back almost normal today at 8.7, hemoglobin 9.6 and hematocrit 29.1. He is afebrile and not hypoxic.  He is on TC 4 to 5 liters.  ,will discharge with levaquin to finish the course of abx ,can cnontinue dueonebs and resume tube feeds 2. Acute renal failure secondary to dehydration and SIRS, improved. His BUN and creatinine on admission were 34 and 1.53, improved and most recent BUN is 24 and creatinine 1.1 on  03/21. He feels much better and back to baseline. He was discharged to [facility] in stable  condition.  for htn continued on norvasc TIME SPENT ON DISCHARGE PREPARATION: More than 30 minutes.    ____________________________ Katha HammingSnehalatha Dougles Kimmey, MD sk:bjt D: 05/09/2011 16:32:20 ET T: 05/09/2011 16:54:46 ET JOB#: [id number]  cc: Katha HammingSnehalatha Jaion Lagrange, MD, <Dictator> Serita ShellerErnest B. Maryellen PileEason, MD Katha HammingSNEHALATHA Zandon Talton MD ELECTRONICALLY SIGNED 05/12/2011 20:10

## 2014-06-11 NOTE — H&P (Signed)
PATIENT NAME:  Billy Hess, Billy Hess MR#:  [id number] DATE OF BIRTH:  1956/02/13  DATE OF ADMISSION:  05/06/2011  REFERRING PHYSICIAN/ER PHYSICIAN:  Dr Mervyn Skeeters  PRIMARY CARE PHYSICIAN: [name].   CHIEF COMPLAINT: Shortness of breath and cough, yellow-green expectoration, and fever.   HISTORY OF PRESENT ILLNESS: The patient is a 59 year old male with past medical history of laryngeal/throat cancer, status post trache and PEG placement, history of recent intracranial hemorrhage, hypertension, severe chronic respiratory failure who is currently a resident of [facility]. The patient was sent from his rehab facility for fever, cough, shortness of breath and yellow-green expectoration. On chest x-ray, the patient was found to have right lower lobe pneumonia and hypoxic respiratory failure. Yellow-green secretions on being suctioned from his tracheostomy.   ALLERGIES: No known drug allergies.   PAST MEDICAL HISTORY:   1. Recent  intracranial  hemorrhage 12/12. The patient was found to have right parietal intraparenchymal hemorrhage and was sent to [facility]. The patient reports that no surgery was done. 2. History of chronic respiratory failure with tracheostomy and PEG tube placement.  3. History of oat/laryngeal cancer. 4. Cerebrovascular accident. 5. Hypertension.   PAST SURGICAL HISTORY: Tracheostomy and PEG tube placement.   CURRENT MEDICATIONS:  1. Amlodipine 5 mg daily.  2. Aspirin 81 mg daily. 3. Rocephin 1 gram IM daily. Started on 05/05/2011. 4. Colace 100 mg b.i.d.  5. DuoNebs q.6 hours p.r.n. and once a day. 6. Hydralazine 10 mg q.6h. p.r.n.  7. Multivitamin 1 tablet daily.  8. Nexium 40 mg daily.  9. Omeprazole 20 mg daily.  10. Trazodone 50 mg daily.  11. Tylenol p.r.n.   FAMILY HISTORY: Denies any significant family history, there is a history of coronary artery disease ,CVA, and cancer in the family.   SOCIAL HISTORY: The patient was a heavy alcohol  drinker and  smoker but not anymore. He used  to drink vodka and 6 packs of beer every day and was smoking.   REVIEW OF SYSTEMS.   CONSTITUTIONAL: Patient reports fever and fatigue.   EYES: Denies any blurred or double vision.   ENT: Denies any tinnitus, ear pain.   RESPIRATORY: Has been having cough, dyspnea, wheezing, yellow-green expectoration.   CARDIOVASCULAR: Denies any chest pain or palpitations.   GI: Denies any nausea, vomiting, diarrhea, abdominal pain.   GU: Denies any dysuria or hematuria.  ENDO:  Denies any polyuria or nocturia.   HEME/LYMPH: Denies any anemia or easy bruisability.   INTEGUMENT:  Denies rash.  MUSCULOSKELETAL: Denies any swelling, gout.   NEUROLOGICAL: Denies any numbness or weakness.   PSYCH: Denies any anxiety or depression.   PHYSICAL EXAMINATION:  VITAL SIGNS: Temperature 98.7, heart rate 132, respiratory rate 18, blood pressure 145/72, pulse oximetry 99%.   GENERAL: The patient is a thin-built African American male sitting in bed. He has a tracheostomy and PEG tube. There is  yellow-green  secretions coming from his tracheostomy tube.  HEAD: Atraumatic, normocephalic.   EYES: There is pallor. No icterus or cyanosis. PERRLA.  Extraocular movements intact.   ENT: Wet mucous membranes. No oropharyngeal erythema or thrush.   NECK: Supple. There is a tracheostomy tube in place.  CHEST WALL: No tenderness to palpation. Not using accessory muscles of respiration. No intercostal muscle retractions.   LUNGS: Bilateral coarse breath sounds and basal crepitations.  CARDIOVASCULAR:  S1 and S2 regular, tachycardic. No murmur, rubs, or gallops.   ABDOMEN: Soft, nontender, nondistended. No guarding or rigidity. No organomegaly. Normal  bowel sounds. There is a PEG tube in place.   SKIN: No rashes or lesions.   EXTREMITIES:  No pedal edema, 1+ pedal pulses.   MUSCULOSKELETAL: No cyanosis or clubbing.   NEUROLOGICAL: Awake, alert, oriented x3. Nonfocal  neurological exam. Cranial nerves are grossly intact.   PSYCH: Normal mood and affect.   LABORATORY, DIAGNOSTIC, AND RADIOLOGICAL DATA: The pH of 7.49, pCO2 31  pO2 62, FiO2 is 28, CO2 31, pO2 62. Chest x-ray shows right base pneumonia. BNP 693. CK 411. White count is 26.4, hemoglobin 10.4, hematocrit 31.3, platelets 238, glucose 218,  BUN 34, creatinine 1.59, sodium 127, potassium 2.1, chloride 91. Troponin normal.   ASSESSMENT AND PLAN: A 59 year old male with past medical history of recent intracranial hemorrhage, hypertension, throat/laryngeal cancer, chronic respiratory failure with trache and PEG who presents with cough, fever, shortness of breath.  1. Hypoxic respiratory failure with SIRS/tachycardia likely with tachycardia leukocytosis. The patient is at very high risk of easy decompensation given his chronic respiratory failure. Will admit him to the hospital, obtain blood and sputum cultures, start on empiric antibiotics. Will place on respiratory treatments and inhaled budesonide. 2. Right lower lobe pneumonia. The patient is from skilled/rehab facility. We will start on empiric antibiotics, vancomycin and Zosyn after obtaining blood and sputum cultures to cover for any nosocomial bacteria. 3. Elevated BNP 693, the patient does not appear to have any underlying history of congestive heart failure. This could be related to SIRS  resulting from acute hypoxic respiratory failure. 4. Mildly elevated CK possibly due to cough and respiratory muscle strain. The patient denies any chest pain, will hydrate with IV fluids and monitor.  5. Leukocytosis, likely white count 26.4, likely due to acute infection, we will monitor closely.  6. Anemia, likely of chronic disease. We will monitor closely.  7. Hyperglycemia: The patient has no history of diabetes. His glucose is 218, we will place on insulin sliding scale and check a hemoglobin A1c. Will ask dietitian to reassess his tube feeds.  8. Chronic renal  failure likely due to dehydration resulting from acute hypoxic respiratory failure and poor air as per history,  the patient has been refusing PEG tube feedings the last few days. Will hydrate with IV fluids and monitor strict ins and outs. We are going to renally dose all his medications    9. Hypokalemia. We will replace p.o. and IV.  10. Hyponatremia appears to be chronic level was 129 in December 2012, this could be related to his recent intracranial hemorrhage. His serum osmolality is normal. Will check a urine osmolality and spot urine, sodium. 11. Hypertension. Appears to be well controlled. in view of tachycardia, we will switch his antihypertensive medication to beta blocker. 12. History of cerebrovascular accident. We will continue aspirin.  13. Gastroesophageal reflux disease. We will continue  PPI for placement. Will place on GI DVT prophylaxis .   Reviewed old medical records, discussed with the ED physician.   Critical care Time Spent: 45 minutes   ____________________________ Darrick MeigsSangeeta Dayelin Balducci, MD sp:ljs D: 05/06/2011 12:00:09 ET T: 05/06/2011 12:39:37 ET JOB#: [id number]  cc: Darrick MeigsSangeeta Sollie Vultaggio, MD, <Dictator> Alice C. Buford DresserFinnell, MD Serita ShellerErnest B. Maryellen PileEason, MD Darrick MeigsSANGEETA Jake Goodson MD ELECTRONICALLY SIGNED 05/09/2011 13:43

## 2014-06-11 NOTE — Discharge Summary (Signed)
PATIENT NAME:  Billy Hess, Billy Hess MR#:  [id number] DATE OF BIRTH:  [birth year]  DATE OF ADMISSION:  05/18/2011 DATE OF DISCHARGE:  05/19/2011  DISCHARGE DIAGNOSES:  1. New onset seizure likely from underlying brain infarct which is old. Patient is started on Keppra now. Patient has not had any seizure while in the hospital.  2. Hypotension, resolved. Will stop Norvasc and hydralazine for now.  3. Hypomagnesemia, repleted.  4. Acute renal failure improving with hydration, likely due to dehydration and prerenal etiology.  5. Hyponatremia, resolved with hydration, likely due to dehydration.   SECONDARY DIAGNOSES:  1. History of intracranial hemorrhage.  2. Chronic respiratory failure with tracheostomy and percutaneous endoscopic gastrostomy.  3. History of throat and laryngeal cancer.  4. History of cerebrovascular accident.  5. Hypertension.  6. Depression.   CONSULTATION: Neurology, [name].   LABORATORY, DIAGNOSTIC AND RADIOLOGICAL DATA:  CT scan of the head without contrast in the ED on 03/31 showed bilateral basal ganglia lacunar infarct. Right maxillary sinusitis. No new intracranial pathology.   Chest x-ray on 03/31 showed no acute cardiopulmonary disease. MRI of the brain without contrast on 03/31 showed previous right occipital parenchymal hemorrhage. Possible right maxillary sinusitis. Chronic small vessel ischemic disease.   Urinalysis on admission was negative.   HISTORY AND SHORT HOSPITAL COURSE: Patient is a 59 year old male with above-mentioned medical problems was admitted for new onset seizure. Please see [name]'s dictated history and physical for further details. Patient was loaded with IV fosphenytoin and was given Keppra. Patient did not have any more seizures while in the hospital. He remained hypotensive for which his blood pressure medications were held including Norvasc and hydralazine. He was evaluated by neurology, [name], who recommended  continuing him on Keppra and discharge him back to the facility. Patient had hypomagnesemia which was repleted and resolved. His hypotension was thought to be due to dehydration and has also been better. He also had acute renal failure which was thought to be prerenal and has been improving with hydration. His hyponatremia also resolved with hydration. Patient has been feeling much better and is back to baseline. He is being discharged back to his facility in stable condition.   PHYSICAL EXAMINATION: VITAL SIGNS: On the date of discharge his vital signs are as follows: Temperature 97.8, heart rate 83 per minute, respirations 20 per minute, blood pressure 112/83 mmHg. He is saturating 100% on room air. Pertinent Physical Examination on the date of discharge: CARDIOVASCULAR: S1, S2 normal. No murmurs, rubs, or gallop. LUNGS: Clear to auscultation bilaterally. No wheezing, rales, rhonchi, crepitation. ABDOMEN: Soft, benign. PEG tube in place. No signs of infection. NEUROLOGIC: Nonfocal examination. All other physical examination remained at the baseline.   DISCHARGE MEDICATIONS:  1. Aspirin 81 mg via through G-tube daily.  2. DuoNebs 3 mL q.6 hours as needed.  3. Omeprazole 20 mg via G-tube daily.  4. Pivot 237 mL q.4 hours as a tube feed. 5. Colace 100 mg via G-tube twice a day.  6. DuoNebs 3 mL inhaled once a day.  7. Multivitamins once via G-tube daily.  8. Nexium 40 mg via G-tube daily.  9. Trazodone 50 mg via G-tube before bedtime.  10. Keppra 1000 mg p.o. b.i.d. for three days then 500 mg p.o. b.i.d. afterwards.   DISCHARGE DIET: As tube feed dictated above (Pivot).    DISCHARGE ACTIVITY: As tolerated.   DISCHARGE INSTRUCTIONS AND FOLLOW UP:  1. Patient was instructed to follow up with his primary care  physician, [name], in 2 to 3 days.  2. He will need follow up with Dr. Suzan Slick from neurology in 3 to 4 weeks.  3. He will get physical therapy evaluation and management while at the  facility.    TOTAL TIME DISCHARGING THIS PATIENT: 45 minutes.   ____________________________ [name]. Sherryll Burger, MD vss:cms D: 05/19/2011 12:53:36 ET T: 05/19/2011 13:09:47 ET JOB#: [id number]  cc: Harli Engelken S. Sherryll Burger, MD, <Dictator> Serita Sheller. Maryellen Pile, MD Rose Phi. Kemper Durie, MD [name] [facility] MD ELECTRONICALLY SIGNED 05/20/2011 21:35

## 2014-06-11 NOTE — Consult Note (Signed)
PATIENT NAME:  Billy Hess, Billy Hess MR#:  [id number] DATE OF BIRTH:  03-30-1955  DATE OF CONSULTATION:  05/19/2011  REFERRING PHYSICIAN:  Katha HammingSnehalatha Konidena, MD CONSULTING PHYSICIAN:  Rose PhiPeter R. Kemper Durielarke, MD  HISTORY: [name] is a 59 year old right-handed African American former [employer], a resident of [facility] nursing home and patient of Dr. 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 He is now admitted 05/18/2011 and is referred for evaluation of new seizures. History comes from the patient, his hospital chart, and from his hospital records.   The patient was brought to the emergency room at 10:35 a.m. on 05/18/2011 from [facility] by EMTs summoned by the nursing home staff after reportedly witnessing two seizures. He reportedly had a third seizure with EMTs en route.  In the emergency room, he was loaded with 1000 mg of IV Cerebyx and started on maintenance doses of phenytoin had on Keppra 500 mg twice a day. He has had no further seizure spells in the hospital. Per EMT report from 03/31/2010, the patient was brought to the hospital and found to have a brain hemorrhage, indicates that the patient was found to be confused and to have left upper extremity weakness, suggestive of a postictal state, possibly that he had a seizure with that. He has no history otherwise of stroke or temporary stroke and no history suggestive of seizure before December 2012. He has not had meningitis.   PHYSICAL EXAMINATION: The patient is a well-developed well-nourished African American gentleman who was examined lying  supine and supine and notable for a tracheostomy, and in no apparent distress with blood pressure 100/80 and heart rate 76 with no fever. Head was normocephalic with supple neck. Mental status was normal, although cognitive testing was not performed in detail. He was alert and was fully oriented with clear speech and expression. He was lucid and a good historian with normal affect. Cranial nerve examination was notable for mild nystagmus with gaze to the right, of lateral nystagmus with gaze paretic quality. Cranial nerve examination was otherwise symmetric and normal including full visual field to finger count for each eye; visual acuity was not tested. Motor examination of the extremities showed normal tone and good power throughout the four extremities. On coordination examination, he was seen to have very mild finger-to-nose intention tremor bilaterally. Reflexes were symmetric at 1 to 2+ at the knees, were trace at both ankles, were mildly asymmetric at the upper extremities,  2+ on the right, and 2 to 3+ on the left.   His gait was not tested.   IMPRESSION: 1. History of heavy ethanol and tobacco use in the past.  2. February 08, 2011 right occipital lobe parietal bleed with history suggestive of having had a seizure at that time.  3. New onset of seizures precipitating present admission, likely associated with right occipitoparietal area focus from prior bleed.    RECOMMENDATIONS:  1. I agree with his present work-up and treatment in the hospital including initiation of antiseizure therapy.  2. I do not see any indication for lumbar puncture.  3. I recommend he be on Keppra 1000 mg twice a day for three days and then on  500 mg twice a day long term.   I appreciate being asked to see this pleasant and interesting gentleman.  ____________________________ Rose Phi. Kemper Durie, MD prc:slb D: 05/19/2011 11:44:31 ET T: 05/19/2011 12:45:31 ET JOB#: [id number]  cc: Rose Phi. Kemper Durie, MD, <Dictator> Gaspar Garbe MD ELECTRONICALLY SIGNED 06/11/2011 10:57

## 2014-10-19 DEATH — deceased
